# Patient Record
Sex: Female | Born: 1950
Health system: Southern US, Community
[De-identification: ages and names within clinical notes are randomized; demographics above are authoritative.]

## PROBLEM LIST (undated history)

## (undated) DIAGNOSIS — R197 Diarrhea, unspecified: Secondary | ICD-10-CM

## (undated) DIAGNOSIS — K589 Irritable bowel syndrome without diarrhea: Secondary | ICD-10-CM

## (undated) DIAGNOSIS — Z9889 Other specified postprocedural states: Secondary | ICD-10-CM

## (undated) DIAGNOSIS — J40 Bronchitis, not specified as acute or chronic: Secondary | ICD-10-CM

## (undated) HISTORY — PX: SHOULDER OPEN ROTATOR CUFF REPAIR: SHX2407

## (undated) HISTORY — PX: LYMPH NODE BIOPSY: SHX201

## (undated) HISTORY — PX: PARTIAL HYSTERECTOMY: SHX80

## (undated) HISTORY — PX: LYMPH GLAND EXCISION: SHX13

## (undated) HISTORY — PX: ABDOMINAL HYSTERECTOMY: SHX81

## (undated) HISTORY — PX: JOINT REPLACEMENT: SHX530

## (undated) HISTORY — PX: BLADDER SURGERY: SHX569

---

## 1997-09-04 ENCOUNTER — Ambulatory Visit (HOSPITAL_COMMUNITY): Admission: RE | Admit: 1997-09-04 | Discharge: 1997-09-05 | Payer: Self-pay | Admitting: Neurosurgery

## 1997-09-18 ENCOUNTER — Ambulatory Visit (HOSPITAL_COMMUNITY): Admission: RE | Admit: 1997-09-18 | Discharge: 1997-09-18 | Payer: Self-pay | Admitting: Neurosurgery

## 1998-07-30 ENCOUNTER — Encounter: Payer: Self-pay | Admitting: Neurosurgery

## 1998-07-30 ENCOUNTER — Ambulatory Visit (HOSPITAL_COMMUNITY): Admission: RE | Admit: 1998-07-30 | Discharge: 1998-07-30 | Payer: Self-pay | Admitting: Neurosurgery

## 1998-08-19 ENCOUNTER — Encounter: Payer: Self-pay | Admitting: Urology

## 1998-08-24 ENCOUNTER — Observation Stay (HOSPITAL_COMMUNITY): Admission: RE | Admit: 1998-08-24 | Discharge: 1998-08-25 | Payer: Self-pay | Admitting: Urology

## 1998-10-06 ENCOUNTER — Other Ambulatory Visit: Admission: RE | Admit: 1998-10-06 | Discharge: 1998-10-06 | Payer: Self-pay | Admitting: *Deleted

## 2001-12-13 ENCOUNTER — Encounter: Admission: RE | Admit: 2001-12-13 | Discharge: 2001-12-13 | Payer: Self-pay

## 2001-12-27 ENCOUNTER — Ambulatory Visit (HOSPITAL_BASED_OUTPATIENT_CLINIC_OR_DEPARTMENT_OTHER): Admission: RE | Admit: 2001-12-27 | Discharge: 2001-12-27 | Payer: Self-pay | Admitting: Otolaryngology

## 2001-12-27 ENCOUNTER — Encounter (INDEPENDENT_AMBULATORY_CARE_PROVIDER_SITE_OTHER): Payer: Self-pay | Admitting: *Deleted

## 2002-06-24 ENCOUNTER — Ambulatory Visit (HOSPITAL_COMMUNITY): Admission: RE | Admit: 2002-06-24 | Discharge: 2002-06-24 | Payer: Self-pay

## 2004-03-24 ENCOUNTER — Emergency Department (HOSPITAL_COMMUNITY): Admission: EM | Admit: 2004-03-24 | Discharge: 2004-03-24 | Payer: Self-pay | Admitting: Emergency Medicine

## 2004-09-07 ENCOUNTER — Ambulatory Visit: Payer: Self-pay | Admitting: Internal Medicine

## 2005-02-08 ENCOUNTER — Ambulatory Visit: Payer: Self-pay | Admitting: Internal Medicine

## 2007-03-20 ENCOUNTER — Other Ambulatory Visit: Admission: RE | Admit: 2007-03-20 | Discharge: 2007-03-20 | Payer: Self-pay | Admitting: Family Medicine

## 2007-03-25 ENCOUNTER — Ambulatory Visit (HOSPITAL_COMMUNITY): Admission: RE | Admit: 2007-03-25 | Discharge: 2007-03-25 | Payer: Self-pay | Admitting: Family Medicine

## 2007-04-03 ENCOUNTER — Encounter: Admission: RE | Admit: 2007-04-03 | Discharge: 2007-04-03 | Payer: Self-pay | Admitting: Family Medicine

## 2009-02-16 ENCOUNTER — Emergency Department (HOSPITAL_COMMUNITY): Admission: EM | Admit: 2009-02-16 | Discharge: 2009-02-16 | Payer: Self-pay | Admitting: Emergency Medicine

## 2009-03-18 ENCOUNTER — Encounter: Admission: RE | Admit: 2009-03-18 | Discharge: 2009-03-18 | Payer: Self-pay | Admitting: Gastroenterology

## 2009-09-03 ENCOUNTER — Emergency Department (HOSPITAL_COMMUNITY): Admission: EM | Admit: 2009-09-03 | Discharge: 2009-09-03 | Payer: Self-pay | Admitting: Family Medicine

## 2009-09-09 ENCOUNTER — Emergency Department (HOSPITAL_COMMUNITY): Admission: EM | Admit: 2009-09-09 | Discharge: 2009-09-09 | Payer: Self-pay | Admitting: Family Medicine

## 2009-09-17 ENCOUNTER — Emergency Department (HOSPITAL_COMMUNITY): Admission: EM | Admit: 2009-09-17 | Discharge: 2009-09-17 | Payer: Self-pay | Admitting: Emergency Medicine

## 2009-10-04 ENCOUNTER — Emergency Department (HOSPITAL_COMMUNITY): Admission: EM | Admit: 2009-10-04 | Discharge: 2009-10-04 | Payer: Self-pay | Admitting: Family Medicine

## 2010-01-30 ENCOUNTER — Encounter: Payer: Self-pay | Admitting: Family Medicine

## 2010-03-24 LAB — CBC
Hemoglobin: 15.6 g/dL — ABNORMAL HIGH (ref 12.0–15.0)
Platelets: 250 10*3/uL (ref 150–400)
RBC: 4.72 MIL/uL (ref 3.87–5.11)
RDW: 13.4 % (ref 11.5–15.5)

## 2010-03-24 LAB — POCT I-STAT, CHEM 8
BUN: 8 mg/dL (ref 6–23)
HCT: 49 % — ABNORMAL HIGH (ref 36.0–46.0)
Sodium: 141 mEq/L (ref 135–145)
TCO2: 30 mmol/L (ref 0–100)

## 2010-03-31 LAB — URINALYSIS, ROUTINE W REFLEX MICROSCOPIC: Specific Gravity, Urine: 1.001 — ABNORMAL LOW (ref 1.005–1.030)

## 2010-05-27 NOTE — Op Note (Signed)
NAMEJACKLIN, Lacey Skinner                          ACCOUNT NO.:  [id number]   MEDICAL RECORD NO.:  [id number]                   PATIENT TYPE:  AMB   LOCATION:  [facility]SC                                  FACILITY:  [facility]   PHYSICIAN:  Christopher E. Ezzard Standing, M.D.         DATE OF BIRTH:  [birth year]   DATE OF PROCEDURE:  12/27/2001  DATE OF DISCHARGE:                                 OPERATIVE REPORT   PREOPERATIVE DIAGNOSIS:  Hoarseness with leukoplakia.   POSTOPERATIVE DIAGNOSIS:  Hoarseness with leukoplakia.   PROCEDURE:  Microlaryngoscopy with vocal cord stripping and biopsy of vocal  cords bilaterally.   SURGEON:  Kristine Garbe. Ezzard Standing, M.D.   ANESTHESIA:  General endotracheal.   COMPLICATIONS:  None.   BRIEF CLINICAL NOTE:  The patient is a 60 year old female who has a  significant smoking history, approximately two packs per day for a number of  years.  She has had chronic hoarseness over the last couple of months and on  examination has bilateral vocal cord edema, especially on the left side,  along with leukoplakia bilaterally of the vocal cords.  She was taken to the  operating room at this time for vocal cord stripping and biopsy of the  leukoplakia areas.  Of note, she has a significant history of family history  with upper airway cancers.   DESCRIPTION OF PROCEDURE:  After adequate endotracheal anesthesia, direct  laryngoscopy was performed.  The base of tongue, valleculae, epiglottis all  appeared normal.  Both piriform sinuses were clear with normal mucosal  covering.  Next the endolarynx was examined.  The false cords were normal.  On evaluation of the true vocal cords, the patient had significantly more  edema of the left true vocal cord than the right.  There were areas of  leukoplakia along the anterior portion of the vocal cords bilaterally.  The  laryngoscope was suspended.  A couple of photos were obtained of the vocal  cords prior to biopsy.  First the right true  vocal cord was stripped along  with an area of leukoplakia, being careful to preserve the anterior  commissure region.  This was sent as a separate specimen and following this,  the more edematous left true vocal cord was stripped along with an area of  leukoplakia.  A cottonoid soaked in adrenalin was placed for hemostasis.  This completed the procedure.  The patient was awoken from anesthesia and  transferred to the recovery room postop doing well.    DISPOSITION:  The patient is discharged home later this morning.  Will place  on antacid therapy, Prilosec 20 mg a day for the next three weeks, as she  has done this previously.  Instructions on voice rest and try to decrease  smoking.  Will have her follow up in my office in two to three weeks for  recheck and to review pathology.  Kristine Garbe. Ezzard Standing, M.D.    CEN/MEDQ  D:  12/27/2001  T:  12/28/2001  Job:  [id number]   cc:   Christella Noa, M.D.  [address] [place]  [place], [place] [address]  Fax: [phone]

## 2010-07-07 ENCOUNTER — Other Ambulatory Visit: Payer: Self-pay | Admitting: Gastroenterology

## 2010-07-07 DIAGNOSIS — R1013 Epigastric pain: Secondary | ICD-10-CM

## 2010-07-11 ENCOUNTER — Ambulatory Visit
Admission: RE | Admit: 2010-07-11 | Discharge: 2010-07-11 | Disposition: A | Discharge status: Home / Self Care | Payer: No Typology Code available for payment source | Source: Ambulatory Visit | Attending: Gastroenterology | Admitting: Gastroenterology

## 2010-07-11 DIAGNOSIS — R1013 Epigastric pain: Secondary | ICD-10-CM

## 2010-07-12 ENCOUNTER — Inpatient Hospital Stay (INDEPENDENT_AMBULATORY_CARE_PROVIDER_SITE_OTHER)
Admission: RE | Admit: 2010-07-12 | Discharge: 2010-07-12 | Disposition: A | Discharge status: Home / Self Care | Payer: No Typology Code available for payment source | Source: Ambulatory Visit | Attending: Family Medicine | Admitting: Family Medicine

## 2010-07-12 DIAGNOSIS — T50995A Adverse effect of other drugs, medicaments and biological substances, initial encounter: Secondary | ICD-10-CM

## 2010-07-12 DIAGNOSIS — I10 Essential (primary) hypertension: Secondary | ICD-10-CM

## 2010-07-21 ENCOUNTER — Other Ambulatory Visit: Payer: Self-pay | Admitting: Gastroenterology

## 2011-12-29 ENCOUNTER — Emergency Department (HOSPITAL_COMMUNITY)
Admission: EM | Admit: 2011-12-29 | Discharge: 2011-12-29 | Disposition: A | Discharge status: Home / Self Care | Payer: No Typology Code available for payment source | Source: Home / Self Care

## 2011-12-29 ENCOUNTER — Emergency Department (INDEPENDENT_AMBULATORY_CARE_PROVIDER_SITE_OTHER): Payer: No Typology Code available for payment source

## 2011-12-29 ENCOUNTER — Encounter (HOSPITAL_COMMUNITY): Payer: Self-pay

## 2011-12-29 DIAGNOSIS — M25519 Pain in unspecified shoulder: Secondary | ICD-10-CM

## 2011-12-29 DIAGNOSIS — M7500 Adhesive capsulitis of unspecified shoulder: Secondary | ICD-10-CM

## 2011-12-29 DIAGNOSIS — F17200 Nicotine dependence, unspecified, uncomplicated: Secondary | ICD-10-CM

## 2011-12-29 MED ORDER — HYDROCODONE-ACETAMINOPHEN 7.5-500 MG PO TABS: 1.0000 | ORAL_TABLET | Freq: Four times a day (QID) | ORAL | Status: DC | PRN

## 2011-12-29 NOTE — ED Provider Notes (Signed)
History     CSN: [id number]  Arrival date & time 12/29/11  1647   Chief Complaint  Patient presents with  . Shoulder Pain   HPI Pt reports a 2 years of worsening left shoulder pain and disability.  She is not having difficulty with abduction and internal rotation of shoulder.  She is not able to sleep well at night.  She is reporting that she is weaker in the left shoulder and not able to sleep well.  She is having difficulty driving because of shoulder pain.    History reviewed. No pertinent past medical history.  Past Surgical History  Procedure Date  . Lymph node biopsy     No family history on file.  History  Substance Use Topics  . Smoking status: Not on file  . Smokeless tobacco: Not on file  . Alcohol Use:     OB History    Grav Para Term Preterm Abortions TAB SAB Ect Mult Living                 Review of Systems  Constitutional: Negative.   HENT: Negative.   Eyes: Negative.   Respiratory: Negative.   Cardiovascular: Negative.   Gastrointestinal: Negative.   Musculoskeletal: Positive for joint swelling and arthralgias.    Allergies  Cortizone-10  Home Medications  No current outpatient prescriptions on file.  BP 138/79  Pulse 79  Temp 97.7 F (36.5 C) (Oral)  Resp 21  SpO2 97%  Physical Exam  Nursing note and vitals reviewed. Constitutional: She is oriented to person, place, and time. She appears well-developed and well-nourished.  HENT:  Head: Normocephalic and atraumatic.  Eyes: EOM are normal. Pupils are equal, round, and reactive to light.  Cardiovascular: Normal rate, regular rhythm and normal heart sounds.   Pulmonary/Chest: Effort normal and breath sounds normal.  Abdominal: Soft. Bowel sounds are normal.  Musculoskeletal:       Frozen left shoulder, severe pain with abduction, internal rotation  Neurological: She is alert and oriented to person, place, and time.  Skin: Skin is warm and dry.  Psychiatric: She has a normal mood and  affect. Her behavior is normal. Judgment and thought content normal.    ED Course  Procedures (including critical care time)  Labs Reviewed - No data to display No results found.  No diagnosis found.   MDM  IMPRESSION  Frozen shoulder - left  Severe shoulder pain  - left  RECOMMENDATIONS / PLAN Orthopedic referral ordered  Xray left shoulder today lortab 7.5 mg po every 6 hours prn severe pain  FOLLOW UP 3 weeks   The patient was given clear instructions to go to ER or return to medical center if symptoms don't improve, worsen or new problems develop.  The patient verbalized understanding.  The patient was told to call to get lab results if they haven't heard anything in the next week.            Cleora Fleet, MD 12/29/11 2156

## 2011-12-29 NOTE — ED Notes (Signed)
C/o left shoulder pain for almost 2 years. States hurt it lifting her husband off the floor

## 2012-01-23 MED ORDER — HYDROCODONE-ACETAMINOPHEN 5-325 MG PO TABS: ORAL_TABLET | ORAL | Status: DC

## 2012-01-25 NOTE — ED Notes (Signed)
Referred to [facility] appt 02/19/12

## 2012-05-30 ENCOUNTER — Ambulatory Visit: Payer: No Typology Code available for payment source | Attending: Orthopaedic Surgery | Admitting: Physical Therapy

## 2012-05-30 DIAGNOSIS — M25519 Pain in unspecified shoulder: Secondary | ICD-10-CM | POA: Insufficient documentation

## 2012-05-30 DIAGNOSIS — IMO0001 Reserved for inherently not codable concepts without codable children: Secondary | ICD-10-CM | POA: Insufficient documentation

## 2012-05-30 DIAGNOSIS — M25619 Stiffness of unspecified shoulder, not elsewhere classified: Secondary | ICD-10-CM | POA: Insufficient documentation

## 2012-06-11 ENCOUNTER — Ambulatory Visit: Payer: No Typology Code available for payment source | Attending: Orthopaedic Surgery | Admitting: Physical Therapy

## 2012-06-11 DIAGNOSIS — M25619 Stiffness of unspecified shoulder, not elsewhere classified: Secondary | ICD-10-CM | POA: Insufficient documentation

## 2012-06-11 DIAGNOSIS — IMO0001 Reserved for inherently not codable concepts without codable children: Secondary | ICD-10-CM | POA: Insufficient documentation

## 2012-06-11 DIAGNOSIS — M25519 Pain in unspecified shoulder: Secondary | ICD-10-CM | POA: Insufficient documentation

## 2012-06-13 ENCOUNTER — Encounter: Payer: No Typology Code available for payment source | Admitting: Rehabilitation

## 2012-06-14 ENCOUNTER — Ambulatory Visit: Payer: No Typology Code available for payment source | Admitting: Rehabilitation

## 2012-06-17 ENCOUNTER — Ambulatory Visit: Payer: No Typology Code available for payment source | Admitting: Physical Therapy

## 2012-06-19 ENCOUNTER — Ambulatory Visit: Payer: No Typology Code available for payment source | Admitting: Physical Therapy

## 2012-06-24 ENCOUNTER — Ambulatory Visit: Payer: No Typology Code available for payment source | Admitting: Physical Therapy

## 2012-06-26 ENCOUNTER — Ambulatory Visit: Payer: No Typology Code available for payment source | Admitting: Physical Therapy

## 2012-07-01 ENCOUNTER — Ambulatory Visit: Payer: No Typology Code available for payment source | Admitting: Physical Therapy

## 2012-07-03 ENCOUNTER — Ambulatory Visit: Payer: No Typology Code available for payment source | Admitting: Physical Therapy

## 2012-07-09 ENCOUNTER — Ambulatory Visit: Payer: No Typology Code available for payment source | Attending: Orthopaedic Surgery | Admitting: Physical Therapy

## 2012-07-09 DIAGNOSIS — M25519 Pain in unspecified shoulder: Secondary | ICD-10-CM | POA: Insufficient documentation

## 2012-07-09 DIAGNOSIS — M25619 Stiffness of unspecified shoulder, not elsewhere classified: Secondary | ICD-10-CM | POA: Insufficient documentation

## 2012-07-09 DIAGNOSIS — IMO0001 Reserved for inherently not codable concepts without codable children: Secondary | ICD-10-CM | POA: Insufficient documentation

## 2012-07-11 ENCOUNTER — Ambulatory Visit: Payer: No Typology Code available for payment source | Admitting: Physical Therapy

## 2012-07-15 ENCOUNTER — Ambulatory Visit: Payer: No Typology Code available for payment source | Admitting: Physical Therapy

## 2012-07-18 ENCOUNTER — Ambulatory Visit: Payer: No Typology Code available for payment source | Admitting: Physical Therapy

## 2012-07-23 ENCOUNTER — Ambulatory Visit: Payer: No Typology Code available for payment source | Admitting: Physical Therapy

## 2012-07-25 ENCOUNTER — Encounter: Payer: No Typology Code available for payment source | Admitting: Physical Therapy

## 2012-07-29 ENCOUNTER — Ambulatory Visit: Payer: No Typology Code available for payment source | Admitting: Physical Therapy

## 2012-08-01 ENCOUNTER — Ambulatory Visit: Payer: No Typology Code available for payment source | Admitting: Physical Therapy

## 2012-08-05 ENCOUNTER — Ambulatory Visit: Payer: No Typology Code available for payment source | Admitting: Physical Therapy

## 2012-08-07 ENCOUNTER — Ambulatory Visit: Payer: No Typology Code available for payment source | Admitting: Rehabilitation

## 2012-08-13 ENCOUNTER — Ambulatory Visit: Payer: No Typology Code available for payment source | Attending: Orthopaedic Surgery | Admitting: Physical Therapy

## 2012-08-13 DIAGNOSIS — M25619 Stiffness of unspecified shoulder, not elsewhere classified: Secondary | ICD-10-CM | POA: Insufficient documentation

## 2012-08-13 DIAGNOSIS — IMO0001 Reserved for inherently not codable concepts without codable children: Secondary | ICD-10-CM | POA: Insufficient documentation

## 2012-08-13 DIAGNOSIS — M25519 Pain in unspecified shoulder: Secondary | ICD-10-CM | POA: Insufficient documentation

## 2012-08-15 ENCOUNTER — Encounter: Payer: No Typology Code available for payment source | Admitting: Physical Therapy

## 2012-08-19 ENCOUNTER — Ambulatory Visit: Payer: No Typology Code available for payment source | Admitting: Physical Therapy

## 2012-08-26 ENCOUNTER — Ambulatory Visit: Payer: No Typology Code available for payment source | Admitting: Rehabilitation

## 2012-09-02 ENCOUNTER — Ambulatory Visit: Payer: No Typology Code available for payment source | Admitting: Physical Therapy

## 2012-09-03 ENCOUNTER — Ambulatory Visit: Payer: No Typology Code available for payment source | Admitting: Physical Therapy

## 2012-09-10 ENCOUNTER — Encounter: Payer: No Typology Code available for payment source | Admitting: Physical Therapy

## 2012-12-10 ENCOUNTER — Encounter (HOSPITAL_COMMUNITY): Payer: Self-pay | Admitting: Emergency Medicine

## 2012-12-10 ENCOUNTER — Emergency Department (HOSPITAL_COMMUNITY)
Admission: EM | Admit: 2012-12-10 | Discharge: 2012-12-10 | Disposition: A | Discharge status: Home / Self Care | Payer: No Typology Code available for payment source | Attending: Emergency Medicine | Admitting: Emergency Medicine

## 2012-12-10 DIAGNOSIS — Z8719 Personal history of other diseases of the digestive system: Secondary | ICD-10-CM | POA: Insufficient documentation

## 2012-12-10 DIAGNOSIS — R509 Fever, unspecified: Secondary | ICD-10-CM | POA: Insufficient documentation

## 2012-12-10 DIAGNOSIS — J209 Acute bronchitis, unspecified: Secondary | ICD-10-CM | POA: Insufficient documentation

## 2012-12-10 DIAGNOSIS — J40 Bronchitis, not specified as acute or chronic: Secondary | ICD-10-CM

## 2012-12-10 DIAGNOSIS — F17200 Nicotine dependence, unspecified, uncomplicated: Secondary | ICD-10-CM | POA: Insufficient documentation

## 2012-12-10 HISTORY — DX: Other specified postprocedural states: Z98.890

## 2012-12-10 HISTORY — DX: Bronchitis, not specified as acute or chronic: J40

## 2012-12-10 HISTORY — DX: Irritable bowel syndrome without diarrhea: K58.9

## 2012-12-10 HISTORY — DX: Diarrhea, unspecified: R19.7

## 2012-12-10 MED ORDER — AZITHROMYCIN 250 MG PO TABS: 250.0000 mg | ORAL_TABLET | Freq: Every day | ORAL | Status: DC

## 2012-12-10 MED ORDER — ALBUTEROL SULFATE HFA 108 (90 BASE) MCG/ACT IN AERS
2.0000 | INHALATION_SPRAY | RESPIRATORY_TRACT | Status: DC | PRN
  Administered 2012-12-10: 2 via RESPIRATORY_TRACT
  Filled 2012-12-10: qty 6.7

## 2012-12-10 NOTE — ED Notes (Addendum)
Pt from home with c/o cough, sinus pain, pressure and drainage, and headache.  Pt reports epitaxis from blowing her nose so often and low grade fever. Took Tylenol at 630 am.  Pt in NAD, A&O.

## 2012-12-10 NOTE — ED Provider Notes (Signed)
CSN: [id number]     Arrival date & time 12/10/12  5784 History   First MD Initiated Contact with Patient 12/10/12 [phone]     Chief Complaint  Patient presents with  . Cough   (Consider location/radiation/quality/duration/timing/severity/associated sxs/prior Treatment) Patient is a 62 y.o. female presenting with cough. The history is provided by the patient. No language interpreter was used.  Cough Associated symptoms: fever   Associated symptoms: no chest pain, no headaches, no shortness of breath and no sore throat   Associated symptoms comment:  Symptoms of cough, sinus congestion, nasal drainage, now with subjective fever. She reports symptoms have been coming on over 2 months, worse in the last week. No vomiting. She denies shortness of breath.   Past Medical History  Diagnosis Date  . Bronchitis   . Diarrhea   . IBS (irritable bowel syndrome)    Past Surgical History  Procedure Laterality Date  . Lymph node biopsy    . Abdominal hysterectomy    . Lymph gland excision      Left Leg  . Partial hysterectomy    . Shoulder open rotator cuff repair      spurs removed too, left shoulder  . Bladder surgery      x2  . Joint replacement     History reviewed. No pertinent family history. History  Substance Use Topics  . Smoking status: Current Every Day Smoker -- 1.00 packs/day    Types: Cigarettes  . Smokeless tobacco: Never Used  . Alcohol Use: No   OB History   Grav Para Term Preterm Abortions TAB SAB Ect Mult Living                 Review of Systems  Constitutional: Positive for fever.  HENT: Positive for congestion and sinus pressure. Negative for sore throat.   Respiratory: Positive for cough. Negative for shortness of breath.   Cardiovascular: Negative for chest pain.  Gastrointestinal: Negative for nausea and abdominal pain.  Genitourinary: Negative for dysuria.  Neurological: Negative for headaches.    Allergies  Cortizone-10; Nicotine; and Other  Home  Medications   Current Outpatient Rx  Name  Route  Sig  Dispense  Refill  . acetaminophen (TYLENOL) 325 MG tablet   Oral   Take 650 mg by mouth every 6 (six) hours as needed.          BP 124/60  Pulse 66  Temp(Src) 98.3 F (36.8 C) (Oral)  Resp 24  SpO2 98% Physical Exam  Constitutional: She is oriented to person, place, and time. She appears well-developed and well-nourished.  HENT:  Head: Normocephalic.  Neck: Normal range of motion. Neck supple.  Cardiovascular: Normal rate and regular rhythm.   Pulmonary/Chest: Effort normal and breath sounds normal. She has no wheezes. She has no rales. She exhibits no tenderness.  Abdominal: Soft. Bowel sounds are normal. There is no tenderness. There is no rebound and no guarding.  Musculoskeletal: Normal range of motion. She exhibits no edema.  Neurological: She is alert and oriented to person, place, and time.  Skin: Skin is warm and dry. No rash noted.  Psychiatric: She has a normal mood and affect.    ED Course  Procedures (including critical care time) Labs Review Labs Reviewed - No data to display Imaging Review No results found.  EKG Interpretation   None       MDM  No diagnosis found. 1. Bronchitis 2. Tobacco abuse  VSS, lungs are clear. She appears very stable.  Will tx with Z-pack, inhaler prn, encouraged PCP follow up.    Arnoldo Hooker, PA-C 12/10/12 [phone]

## 2012-12-10 NOTE — ED Provider Notes (Signed)
Medical screening examination/treatment/procedure(s) were performed by non-physician practitioner and as supervising physician I was immediately available for consultation/collaboration.  EKG Interpretation   None         Megan E Docherty, MD 12/10/12 2013 

## 2012-12-18 ENCOUNTER — Ambulatory Visit: Payer: No Typology Code available for payment source | Attending: Internal Medicine | Admitting: Internal Medicine

## 2012-12-18 ENCOUNTER — Encounter: Payer: Self-pay | Admitting: Internal Medicine

## 2012-12-18 ENCOUNTER — Ambulatory Visit (HOSPITAL_COMMUNITY)
Admission: RE | Admit: 2012-12-18 | Discharge: 2012-12-18 | Disposition: A | Discharge status: Home / Self Care | Payer: No Typology Code available for payment source | Source: Ambulatory Visit | Attending: Internal Medicine | Admitting: Internal Medicine

## 2012-12-18 VITALS — BP 143/46 | HR 80 | Temp 98.7°F | Resp 14 | Ht 62.0 in | Wt 128.8 lb

## 2012-12-18 DIAGNOSIS — R059 Cough, unspecified: Secondary | ICD-10-CM | POA: Insufficient documentation

## 2012-12-18 DIAGNOSIS — R0602 Shortness of breath: Secondary | ICD-10-CM | POA: Insufficient documentation

## 2012-12-18 DIAGNOSIS — J441 Chronic obstructive pulmonary disease with (acute) exacerbation: Secondary | ICD-10-CM

## 2012-12-18 DIAGNOSIS — R05 Cough: Secondary | ICD-10-CM | POA: Insufficient documentation

## 2012-12-18 LAB — CBC WITH DIFFERENTIAL/PLATELET
Basophils Relative: 0 % (ref 0–1)
Eosinophils Absolute: 0.1 10*3/uL (ref 0.0–0.7)
HCT: 42.1 % (ref 36.0–46.0)
Hemoglobin: 14.6 g/dL (ref 12.0–15.0)
Lymphocytes Relative: 40 % (ref 12–46)
MCHC: 34.7 g/dL (ref 30.0–36.0)
MCV: 90.7 fL (ref 78.0–100.0)
Monocytes Absolute: 0.5 10*3/uL (ref 0.1–1.0)
Monocytes Relative: 9 % (ref 3–12)
Neutro Abs: 3 10*3/uL (ref 1.7–7.7)
Neutrophils Relative %: 50 % (ref 43–77)
Platelets: 321 10*3/uL (ref 150–400)
RDW: 14 % (ref 11.5–15.5)
WBC: 6 10*3/uL (ref 4.0–10.5)

## 2012-12-18 LAB — COMPLETE METABOLIC PANEL WITH GFR
ALT: 12 U/L (ref 0–35)
AST: 16 U/L (ref 0–37)
Alkaline Phosphatase: 91 U/L (ref 39–117)
Chloride: 101 mEq/L (ref 96–112)
Sodium: 136 mEq/L (ref 135–145)

## 2012-12-18 LAB — LIPID PANEL
Cholesterol: 202 mg/dL — ABNORMAL HIGH (ref 0–200)
HDL: 65 mg/dL (ref >39)
LDL Cholesterol: 120 mg/dL — ABNORMAL HIGH (ref 0–99)
Total CHOL/HDL Ratio: 3.1 Ratio
Triglycerides: 85 mg/dL (ref <150)

## 2012-12-18 MED ORDER — LEVOFLOXACIN 500 MG PO TABS: 500.0000 mg | ORAL_TABLET | Freq: Every day | ORAL | Status: AC

## 2012-12-18 MED ORDER — PREDNISONE 20 MG PO TABS: 40.0000 mg | ORAL_TABLET | Freq: Every day | ORAL | Status: AC

## 2012-12-18 MED ORDER — FLUTICASONE-SALMETEROL 250-50 MCG/DOSE IN AEPB: 1.0000 | INHALATION_SPRAY | Freq: Two times a day (BID) | RESPIRATORY_TRACT | Status: DC

## 2012-12-18 MED ORDER — ALBUTEROL SULFATE HFA 108 (90 BASE) MCG/ACT IN AERS: 2.0000 | INHALATION_SPRAY | Freq: Four times a day (QID) | RESPIRATORY_TRACT | Status: DC | PRN

## 2012-12-18 NOTE — Progress Notes (Signed)
Patient ID: Lacey Skinner, female   DOB: 08-05-1950, 62 y.o.   MRN: [id number]   CC:  HPI: 62 year old female with a history of nicotine dependence, or smokes about a pack a day who presents to the clinic for a followup from ED visit on 12/2 and she was diagnosed with acute bronchitis. She was prescribed Z-Pak, albuterol and was advised to follow up with her PCP She has not received a flu vaccine and is scared to take it because of side effects. I did explain to her that benefits outweigh the risk. Patient still complains of dyspnea on exertion early in the morning. She also has a productive cough she has not had any fever.   Allergies  Allergen Reactions  . Cortizone-10 [Hydrocortisone] Hives  . Nicotine Hives and Other (See Comments)    Nicotine Patches  . Other Other (See Comments)    States that "several medications give me hives but not sure of the names"   Past Medical History  Diagnosis Date  . Bronchitis   . Diarrhea   . IBS (irritable bowel syndrome)   . Status post spinal disc removal     C4-C5   Current Outpatient Prescriptions on File Prior to Visit  Medication Sig Dispense Refill  . acetaminophen (TYLENOL) 325 MG tablet Take 650 mg by mouth every 6 (six) hours as needed.       No current facility-administered medications on file prior to visit.   No family history on file. History   Social History  . Marital Status: Widowed    Spouse Name: N/A    Number of Children: N/A  . Years of Education: N/A   Occupational History  . Not on file.   Social History Main Topics  . Smoking status: Current Every Day Smoker -- 1.00 packs/day    Types: Cigarettes  . Smokeless tobacco: Never Used  . Alcohol Use: No  . Drug Use: No  . Sexual Activity: No   Other Topics Concern  . Not on file   Social History Narrative  . No narrative on file    Review of Systems  Constitutional: Negative for fever, chills, diaphoresis, activity change, appetite change and fatigue.   HENT: Negative for ear pain, nosebleeds, congestion, facial swelling, rhinorrhea, neck pain, neck stiffness and ear discharge.   Eyes: Negative for pain, discharge, redness, itching and visual disturbance.  Respiratory: Negative for cough, choking, chest tightness, shortness of breath, wheezing and stridor.   Cardiovascular: Negative for chest pain, palpitations and leg swelling.  Gastrointestinal: Negative for abdominal distention.  Genitourinary: Negative for dysuria, urgency, frequency, hematuria, flank pain, decreased urine volume, difficulty urinating and dyspareunia.  Musculoskeletal: Negative for back pain, joint swelling, arthralgias and gait problem.  Neurological: Negative for dizziness, tremors, seizures, syncope, facial asymmetry, speech difficulty, weakness, light-headedness, numbness and headaches.  Hematological: Negative for adenopathy. Does not bruise/bleed easily.  Psychiatric/Behavioral: Negative for hallucinations, behavioral problems, confusion, dysphoric mood, decreased concentration and agitation.    Objective:   Filed Vitals:   12/18/12 0949  BP: 143/46  Pulse: 80  Temp: 98.7 F (37.1 C)  Resp: 14    Physical Exam  Constitutional: Appears well-developed and well-nourished. No distress.  HENT: Normocephalic. External right and left ear normal. Oropharynx is clear and moist.  Eyes: Conjunctivae and EOM are normal. PERRLA, no scleral icterus.  Neck: Normal ROM. Neck supple. No JVD. No tracheal deviation. No thyromegaly.  CVS: RRR, S1/S2 +, no murmurs, no gallops, no carotid bruit.  Pulmonary:  Effort and breath sounds normal, no stridor, rhonchi, wheezes, rales.  Abdominal: Soft. BS +,  no distension, tenderness, rebound or guarding.  Musculoskeletal: Normal range of motion. No edema and no tenderness.  Lymphadenopathy: No lymphadenopathy noted, cervical, inguinal. Neuro: Alert. Normal reflexes, muscle tone coordination. No cranial nerve deficit. Skin: Skin is  warm and dry. No rash noted. Not diaphoretic. No erythema. No pallor.  Psychiatric: Normal mood and affect. Behavior, judgment, thought content normal.   Lab Results  Component Value Date   WBC 8.1 10/04/2009   HGB 16.7* 10/04/2009   HCT 49.0* 10/04/2009   MCV 96.0 10/04/2009   PLT 250 10/04/2009   Lab Results  Component Value Date   CREATININE 0.8 10/04/2009   BUN 8 10/04/2009   NA 141 10/04/2009   K 4.8 10/04/2009   CL 104 10/04/2009    No results found for this basename: HGBA1C   Lipid Panel  No results found for this basename: chol, trig, hdl, cholhdl, vldl, ldlcalc       Assessment and plan:   There are no active problems to display for this patient.      Acute bronchitis/COPD exacerbation Cough for 2 months, symptoms not improving with Z-Pak We'll do a chest x-ray Levaquin 500 mg for 5-7 days Start the patient on Advair 250 x 50 Prednisone 40 mg a day for 7 days Patient advised to followup sooner if his symptoms don't improve otherwise routine follow up and 2 months    The patient was given clear instructions to go to ER or return to medical center if symptoms don't improve, worsen or new problems develop. The patient verbalized understanding. The patient was told to call to get any lab results if not heard anything in the next week.

## 2012-12-18 NOTE — Progress Notes (Signed)
Pt is here for a hospital f/u. Requests antibiotics and an inhaler; would help with the coughing and mucus that isn't clearing up x2 months. Complains of back and shoulder pain x6 months, pain on level 6.

## 2012-12-20 ENCOUNTER — Telehealth: Payer: Self-pay | Admitting: *Deleted

## 2012-12-20 NOTE — Telephone Encounter (Signed)
Contacted pt to inform her that her lab results all came back normal and that the provider recommends a low cholesterol diet. Pt acknowledged her results with no problem.

## 2013-01-23 ENCOUNTER — Telehealth: Payer: Self-pay | Admitting: Internal Medicine

## 2013-01-23 MED ORDER — ALBUTEROL SULFATE HFA 108 (90 BASE) MCG/ACT IN AERS: 2.0000 | INHALATION_SPRAY | Freq: Four times a day (QID) | RESPIRATORY_TRACT | Status: DC | PRN

## 2013-01-23 MED ORDER — FLUTICASONE-SALMETEROL 250-50 MCG/DOSE IN AEPB: 1.0000 | INHALATION_SPRAY | Freq: Two times a day (BID) | RESPIRATORY_TRACT | Status: DC

## 2013-01-23 NOTE — Telephone Encounter (Signed)
Spoke with pt regarding medication refill meds reordered and sent to our pharmacy

## 2013-01-23 NOTE — Telephone Encounter (Signed)
Patient would like to have a medication refill for her inhaler. She would like the cheaper version from our pharmacy. Thank you!

## 2013-01-24 ENCOUNTER — Other Ambulatory Visit: Payer: Self-pay | Admitting: Internal Medicine

## 2013-01-24 MED ORDER — FLUTICASONE-SALMETEROL 250-50 MCG/DOSE IN AEPB: 1.0000 | INHALATION_SPRAY | Freq: Two times a day (BID) | RESPIRATORY_TRACT | Status: AC

## 2013-01-24 MED ORDER — ALBUTEROL SULFATE HFA 108 (90 BASE) MCG/ACT IN AERS: 2.0000 | INHALATION_SPRAY | Freq: Four times a day (QID) | RESPIRATORY_TRACT | Status: AC | PRN

## 2013-03-18 ENCOUNTER — Ambulatory Visit: Payer: Self-pay | Admitting: Internal Medicine

## 2013-09-08 ENCOUNTER — Other Ambulatory Visit: Payer: Self-pay | Admitting: Family Medicine

## 2013-09-08 ENCOUNTER — Other Ambulatory Visit (HOSPITAL_COMMUNITY)
Admission: RE | Admit: 2013-09-08 | Discharge: 2013-09-08 | Disposition: A | Discharge status: Home / Self Care | Payer: Medicare PPO | Source: Ambulatory Visit | Attending: Family Medicine | Admitting: Family Medicine

## 2013-09-08 DIAGNOSIS — Z01419 Encounter for gynecological examination (general) (routine) without abnormal findings: Secondary | ICD-10-CM | POA: Insufficient documentation

## 2013-09-11 LAB — CYTOLOGY - PAP

## 2014-02-16 DIAGNOSIS — Z720 Tobacco use: Secondary | ICD-10-CM | POA: Diagnosis not present

## 2014-02-16 DIAGNOSIS — R252 Cramp and spasm: Secondary | ICD-10-CM | POA: Diagnosis not present

## 2014-02-16 DIAGNOSIS — M25562 Pain in left knee: Secondary | ICD-10-CM | POA: Diagnosis not present

## 2014-03-02 ENCOUNTER — Ambulatory Visit: Payer: Medicare PPO | Attending: Family Medicine

## 2014-07-06 ENCOUNTER — Other Ambulatory Visit: Payer: Self-pay

## 2014-08-25 DIAGNOSIS — J45909 Unspecified asthma, uncomplicated: Secondary | ICD-10-CM | POA: Diagnosis not present

## 2014-08-25 DIAGNOSIS — F17200 Nicotine dependence, unspecified, uncomplicated: Secondary | ICD-10-CM | POA: Diagnosis not present

## 2014-08-25 DIAGNOSIS — F419 Anxiety disorder, unspecified: Secondary | ICD-10-CM | POA: Diagnosis not present

## 2014-08-25 DIAGNOSIS — Z1389 Encounter for screening for other disorder: Secondary | ICD-10-CM | POA: Diagnosis not present

## 2014-08-25 DIAGNOSIS — F329 Major depressive disorder, single episode, unspecified: Secondary | ICD-10-CM | POA: Diagnosis not present

## 2014-08-25 DIAGNOSIS — Z0000 Encounter for general adult medical examination without abnormal findings: Secondary | ICD-10-CM | POA: Diagnosis not present

## 2014-08-25 DIAGNOSIS — Z136 Encounter for screening for cardiovascular disorders: Secondary | ICD-10-CM | POA: Diagnosis not present

## 2014-08-25 DIAGNOSIS — K219 Gastro-esophageal reflux disease without esophagitis: Secondary | ICD-10-CM | POA: Diagnosis not present

## 2014-08-28 ENCOUNTER — Other Ambulatory Visit: Payer: Self-pay | Admitting: Family Medicine

## 2014-09-03 ENCOUNTER — Other Ambulatory Visit: Payer: Self-pay | Admitting: Family Medicine

## 2014-09-03 DIAGNOSIS — F17290 Nicotine dependence, other tobacco product, uncomplicated: Secondary | ICD-10-CM

## 2014-11-06 DIAGNOSIS — Z803 Family history of malignant neoplasm of breast: Secondary | ICD-10-CM | POA: Diagnosis not present

## 2014-11-06 DIAGNOSIS — Z1231 Encounter for screening mammogram for malignant neoplasm of breast: Secondary | ICD-10-CM | POA: Diagnosis not present

## 2015-02-02 DIAGNOSIS — B86 Scabies: Secondary | ICD-10-CM | POA: Diagnosis not present

## 2015-02-13 DIAGNOSIS — H5203 Hypermetropia, bilateral: Secondary | ICD-10-CM | POA: Diagnosis not present

## 2015-02-13 DIAGNOSIS — H5210 Myopia, unspecified eye: Secondary | ICD-10-CM | POA: Diagnosis not present

## 2015-06-15 ENCOUNTER — Other Ambulatory Visit: Payer: Self-pay | Admitting: Family Medicine

## 2015-06-15 ENCOUNTER — Ambulatory Visit
Admission: RE | Admit: 2015-06-15 | Discharge: 2015-06-15 | Disposition: A | Discharge status: Home / Self Care | Payer: Medicare PPO | Source: Ambulatory Visit | Attending: Family Medicine | Admitting: Family Medicine

## 2015-06-15 DIAGNOSIS — M546 Pain in thoracic spine: Secondary | ICD-10-CM

## 2015-06-15 DIAGNOSIS — Z9181 History of falling: Secondary | ICD-10-CM | POA: Diagnosis not present

## 2015-11-17 DIAGNOSIS — R252 Cramp and spasm: Secondary | ICD-10-CM | POA: Diagnosis not present

## 2015-11-17 DIAGNOSIS — F17200 Nicotine dependence, unspecified, uncomplicated: Secondary | ICD-10-CM | POA: Diagnosis not present

## 2015-11-17 DIAGNOSIS — Z1389 Encounter for screening for other disorder: Secondary | ICD-10-CM | POA: Diagnosis not present

## 2015-11-17 DIAGNOSIS — Z136 Encounter for screening for cardiovascular disorders: Secondary | ICD-10-CM | POA: Diagnosis not present

## 2015-11-17 DIAGNOSIS — J45909 Unspecified asthma, uncomplicated: Secondary | ICD-10-CM | POA: Diagnosis not present

## 2015-11-17 DIAGNOSIS — Z0000 Encounter for general adult medical examination without abnormal findings: Secondary | ICD-10-CM | POA: Diagnosis not present

## 2015-11-17 DIAGNOSIS — Z1159 Encounter for screening for other viral diseases: Secondary | ICD-10-CM | POA: Diagnosis not present

## 2015-11-17 DIAGNOSIS — Z1211 Encounter for screening for malignant neoplasm of colon: Secondary | ICD-10-CM | POA: Diagnosis not present

## 2015-11-17 DIAGNOSIS — K219 Gastro-esophageal reflux disease without esophagitis: Secondary | ICD-10-CM | POA: Diagnosis not present

## 2015-11-24 DIAGNOSIS — Z1211 Encounter for screening for malignant neoplasm of colon: Secondary | ICD-10-CM | POA: Diagnosis not present

## 2015-12-06 DIAGNOSIS — Z1231 Encounter for screening mammogram for malignant neoplasm of breast: Secondary | ICD-10-CM | POA: Diagnosis not present

## 2016-01-17 ENCOUNTER — Telehealth: Payer: Self-pay | Admitting: Obstetrics and Gynecology

## 2016-01-17 NOTE — Telephone Encounter (Signed)
Erroneous encounter

## 2016-01-18 ENCOUNTER — Other Ambulatory Visit: Payer: Self-pay | Admitting: Family Medicine

## 2016-01-18 ENCOUNTER — Ambulatory Visit
Admission: RE | Admit: 2016-01-18 | Discharge: 2016-01-18 | Disposition: A | Discharge status: Home / Self Care | Payer: Medicare PPO | Source: Ambulatory Visit | Attending: Family Medicine | Admitting: Family Medicine

## 2016-01-18 DIAGNOSIS — M25441 Effusion, right hand: Secondary | ICD-10-CM

## 2016-01-18 DIAGNOSIS — J45909 Unspecified asthma, uncomplicated: Secondary | ICD-10-CM | POA: Diagnosis not present

## 2016-01-18 DIAGNOSIS — M19041 Primary osteoarthritis, right hand: Secondary | ICD-10-CM | POA: Diagnosis not present

## 2016-01-18 DIAGNOSIS — R252 Cramp and spasm: Secondary | ICD-10-CM | POA: Diagnosis not present

## 2016-04-04 DIAGNOSIS — F17200 Nicotine dependence, unspecified, uncomplicated: Secondary | ICD-10-CM | POA: Diagnosis not present

## 2016-04-04 DIAGNOSIS — R05 Cough: Secondary | ICD-10-CM | POA: Diagnosis not present

## 2016-04-04 DIAGNOSIS — R5383 Other fatigue: Secondary | ICD-10-CM | POA: Diagnosis not present

## 2016-04-04 DIAGNOSIS — J439 Emphysema, unspecified: Secondary | ICD-10-CM | POA: Diagnosis not present

## 2016-04-04 DIAGNOSIS — R252 Cramp and spasm: Secondary | ICD-10-CM | POA: Diagnosis not present

## 2016-04-05 ENCOUNTER — Other Ambulatory Visit: Payer: Self-pay | Admitting: Family Medicine

## 2016-04-05 ENCOUNTER — Ambulatory Visit
Admission: RE | Admit: 2016-04-05 | Discharge: 2016-04-05 | Disposition: A | Discharge status: Home / Self Care | Payer: Medicare PPO | Source: Ambulatory Visit | Attending: Family Medicine | Admitting: Family Medicine

## 2016-04-05 DIAGNOSIS — R05 Cough: Secondary | ICD-10-CM

## 2016-04-05 DIAGNOSIS — R059 Cough, unspecified: Secondary | ICD-10-CM

## 2016-04-17 ENCOUNTER — Other Ambulatory Visit: Payer: Self-pay | Admitting: Cardiology

## 2016-04-17 DIAGNOSIS — K279 Peptic ulcer, site unspecified, unspecified as acute or chronic, without hemorrhage or perforation: Secondary | ICD-10-CM | POA: Diagnosis not present

## 2016-04-17 DIAGNOSIS — R079 Chest pain, unspecified: Secondary | ICD-10-CM

## 2016-04-17 DIAGNOSIS — I209 Angina pectoris, unspecified: Secondary | ICD-10-CM | POA: Diagnosis not present

## 2016-04-17 DIAGNOSIS — F17290 Nicotine dependence, other tobacco product, uncomplicated: Secondary | ICD-10-CM | POA: Diagnosis not present

## 2016-04-17 DIAGNOSIS — M1990 Unspecified osteoarthritis, unspecified site: Secondary | ICD-10-CM | POA: Diagnosis not present

## 2016-04-17 DIAGNOSIS — J449 Chronic obstructive pulmonary disease, unspecified: Secondary | ICD-10-CM | POA: Diagnosis not present

## 2016-04-17 DIAGNOSIS — Z720 Tobacco use: Secondary | ICD-10-CM | POA: Diagnosis not present

## 2016-04-17 DIAGNOSIS — Z716 Tobacco abuse counseling: Secondary | ICD-10-CM | POA: Diagnosis not present

## 2016-05-03 ENCOUNTER — Ambulatory Visit (HOSPITAL_COMMUNITY)
Admission: RE | Admit: 2016-05-03 | Discharge: 2016-05-03 | Disposition: A | Discharge status: Home / Self Care | Payer: Medicare HMO | Source: Ambulatory Visit | Attending: Cardiology | Admitting: Cardiology

## 2016-05-03 DIAGNOSIS — R079 Chest pain, unspecified: Secondary | ICD-10-CM

## 2016-05-03 DIAGNOSIS — F17290 Nicotine dependence, other tobacco product, uncomplicated: Secondary | ICD-10-CM | POA: Diagnosis not present

## 2016-05-03 DIAGNOSIS — I209 Angina pectoris, unspecified: Secondary | ICD-10-CM | POA: Diagnosis not present

## 2016-05-03 DIAGNOSIS — K279 Peptic ulcer, site unspecified, unspecified as acute or chronic, without hemorrhage or perforation: Secondary | ICD-10-CM | POA: Diagnosis not present

## 2016-05-03 DIAGNOSIS — J449 Chronic obstructive pulmonary disease, unspecified: Secondary | ICD-10-CM | POA: Diagnosis not present

## 2016-05-03 DIAGNOSIS — K219 Gastro-esophageal reflux disease without esophagitis: Secondary | ICD-10-CM | POA: Diagnosis not present

## 2016-05-03 DIAGNOSIS — M1990 Unspecified osteoarthritis, unspecified site: Secondary | ICD-10-CM | POA: Diagnosis not present

## 2016-05-03 DIAGNOSIS — R0609 Other forms of dyspnea: Secondary | ICD-10-CM | POA: Diagnosis not present

## 2016-05-03 MED ORDER — REGADENOSON 0.4 MG/5ML IV SOLN: 0.4000 mg | Freq: Once | INTRAVENOUS | Status: DC

## 2016-05-03 MED ORDER — REGADENOSON 0.4 MG/5ML IV SOLN
INTRAVENOUS | Status: AC
  Filled 2016-05-03: qty 5

## 2016-05-03 MED ORDER — TECHNETIUM TC 99M TETROFOSMIN IV KIT
30.0000 | PACK | Freq: Once | INTRAVENOUS | Status: AC | PRN
  Administered 2016-05-03: 30 via INTRAVENOUS

## 2016-05-03 MED ORDER — TECHNETIUM TC 99M TETROFOSMIN IV KIT
10.0000 | PACK | Freq: Once | INTRAVENOUS | Status: AC | PRN
  Administered 2016-05-03: 10 via INTRAVENOUS

## 2016-07-10 DIAGNOSIS — R292 Abnormal reflex: Secondary | ICD-10-CM | POA: Diagnosis not present

## 2016-07-10 DIAGNOSIS — M5417 Radiculopathy, lumbosacral region: Secondary | ICD-10-CM | POA: Diagnosis not present

## 2016-07-11 ENCOUNTER — Other Ambulatory Visit: Payer: Self-pay | Admitting: Family Medicine

## 2016-07-11 DIAGNOSIS — M5416 Radiculopathy, lumbar region: Secondary | ICD-10-CM

## 2016-07-23 ENCOUNTER — Ambulatory Visit
Admission: RE | Admit: 2016-07-23 | Discharge: 2016-07-23 | Disposition: A | Discharge status: Home / Self Care | Payer: Medicare HMO | Source: Ambulatory Visit | Attending: Family Medicine | Admitting: Family Medicine

## 2016-07-23 DIAGNOSIS — M47816 Spondylosis without myelopathy or radiculopathy, lumbar region: Secondary | ICD-10-CM | POA: Diagnosis not present

## 2016-07-23 DIAGNOSIS — M5416 Radiculopathy, lumbar region: Secondary | ICD-10-CM

## 2016-07-26 DIAGNOSIS — E0789 Other specified disorders of thyroid: Secondary | ICD-10-CM | POA: Diagnosis not present

## 2016-07-26 DIAGNOSIS — R070 Pain in throat: Secondary | ICD-10-CM | POA: Diagnosis not present

## 2016-07-26 DIAGNOSIS — M5417 Radiculopathy, lumbosacral region: Secondary | ICD-10-CM | POA: Diagnosis not present

## 2016-07-27 ENCOUNTER — Other Ambulatory Visit: Payer: Self-pay | Admitting: Family Medicine

## 2016-07-27 DIAGNOSIS — E0789 Other specified disorders of thyroid: Secondary | ICD-10-CM

## 2016-07-31 DIAGNOSIS — J320 Chronic maxillary sinusitis: Secondary | ICD-10-CM | POA: Diagnosis not present

## 2016-07-31 DIAGNOSIS — J370 Chronic laryngitis: Secondary | ICD-10-CM | POA: Diagnosis not present

## 2016-07-31 DIAGNOSIS — J342 Deviated nasal septum: Secondary | ICD-10-CM | POA: Diagnosis not present

## 2016-07-31 DIAGNOSIS — R490 Dysphonia: Secondary | ICD-10-CM | POA: Diagnosis not present

## 2016-07-31 DIAGNOSIS — B370 Candidal stomatitis: Secondary | ICD-10-CM | POA: Diagnosis not present

## 2016-07-31 DIAGNOSIS — J330 Polyp of nasal cavity: Secondary | ICD-10-CM | POA: Diagnosis not present

## 2016-07-31 DIAGNOSIS — J301 Allergic rhinitis due to pollen: Secondary | ICD-10-CM | POA: Diagnosis not present

## 2016-07-31 DIAGNOSIS — R05 Cough: Secondary | ICD-10-CM | POA: Diagnosis not present

## 2016-08-07 DIAGNOSIS — M542 Cervicalgia: Secondary | ICD-10-CM | POA: Diagnosis not present

## 2016-08-10 ENCOUNTER — Other Ambulatory Visit: Payer: Self-pay | Admitting: Orthopedic Surgery

## 2016-08-10 DIAGNOSIS — M546 Pain in thoracic spine: Secondary | ICD-10-CM

## 2016-08-10 DIAGNOSIS — M542 Cervicalgia: Secondary | ICD-10-CM

## 2016-08-13 ENCOUNTER — Ambulatory Visit
Admission: RE | Admit: 2016-08-13 | Discharge: 2016-08-13 | Disposition: A | Discharge status: Home / Self Care | Payer: Medicare HMO | Source: Ambulatory Visit | Attending: Orthopedic Surgery | Admitting: Orthopedic Surgery

## 2016-08-13 DIAGNOSIS — M546 Pain in thoracic spine: Secondary | ICD-10-CM | POA: Diagnosis not present

## 2016-08-13 DIAGNOSIS — M5020 Other cervical disc displacement, unspecified cervical region: Secondary | ICD-10-CM | POA: Diagnosis not present

## 2016-08-13 DIAGNOSIS — M5031 Other cervical disc degeneration,  high cervical region: Secondary | ICD-10-CM | POA: Diagnosis not present

## 2016-08-13 DIAGNOSIS — M542 Cervicalgia: Secondary | ICD-10-CM

## 2016-08-18 DIAGNOSIS — M542 Cervicalgia: Secondary | ICD-10-CM | POA: Diagnosis not present

## 2016-08-21 IMAGING — CR DG THORACIC SPINE 3V
3 series · 3 of 3 positions shown · non-contrast
Comparison: 12/18/2012

CLINICAL DATA: Fall onto concrete floor 3 months ago with
persistent pain, initial encounter

EXAM:
THORACIC SPINE - 3 VIEWS

[w thoracic spine ap]
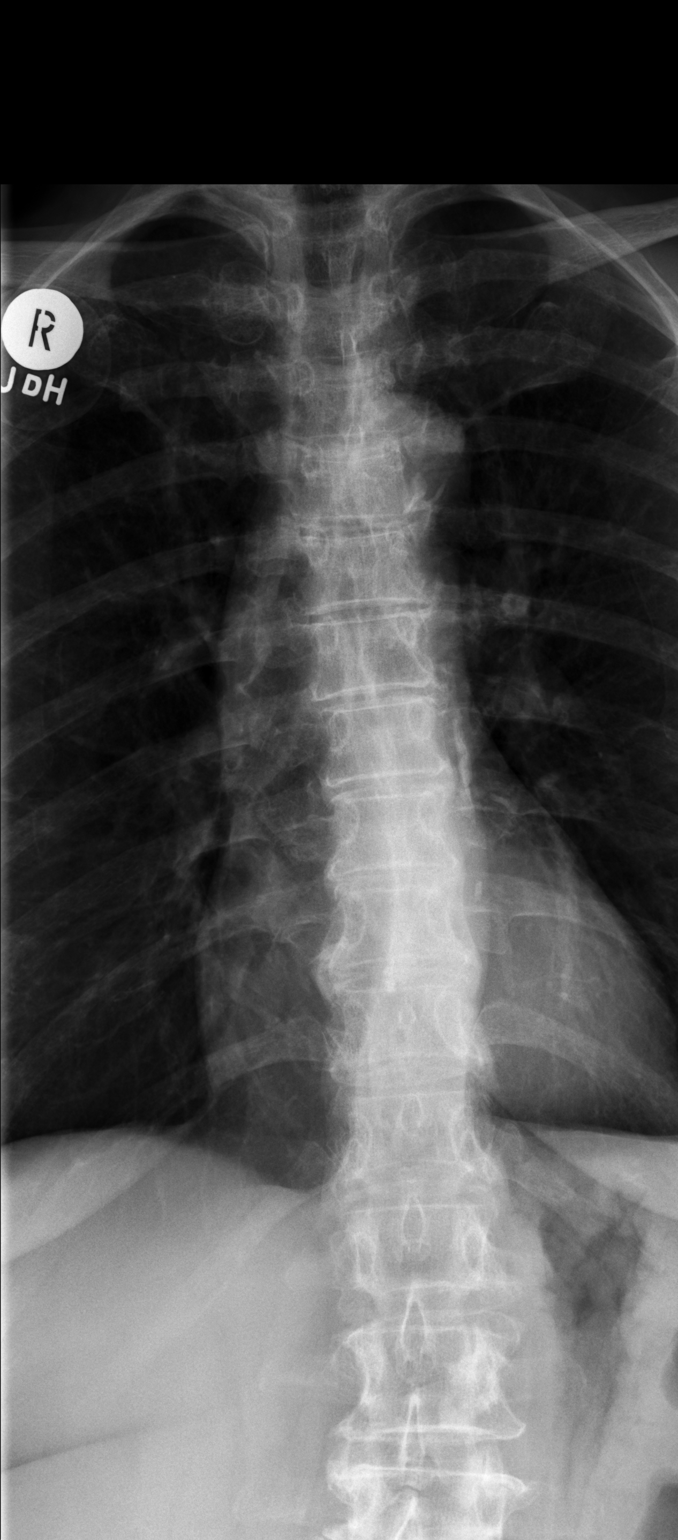

[w thoracic spine lat]
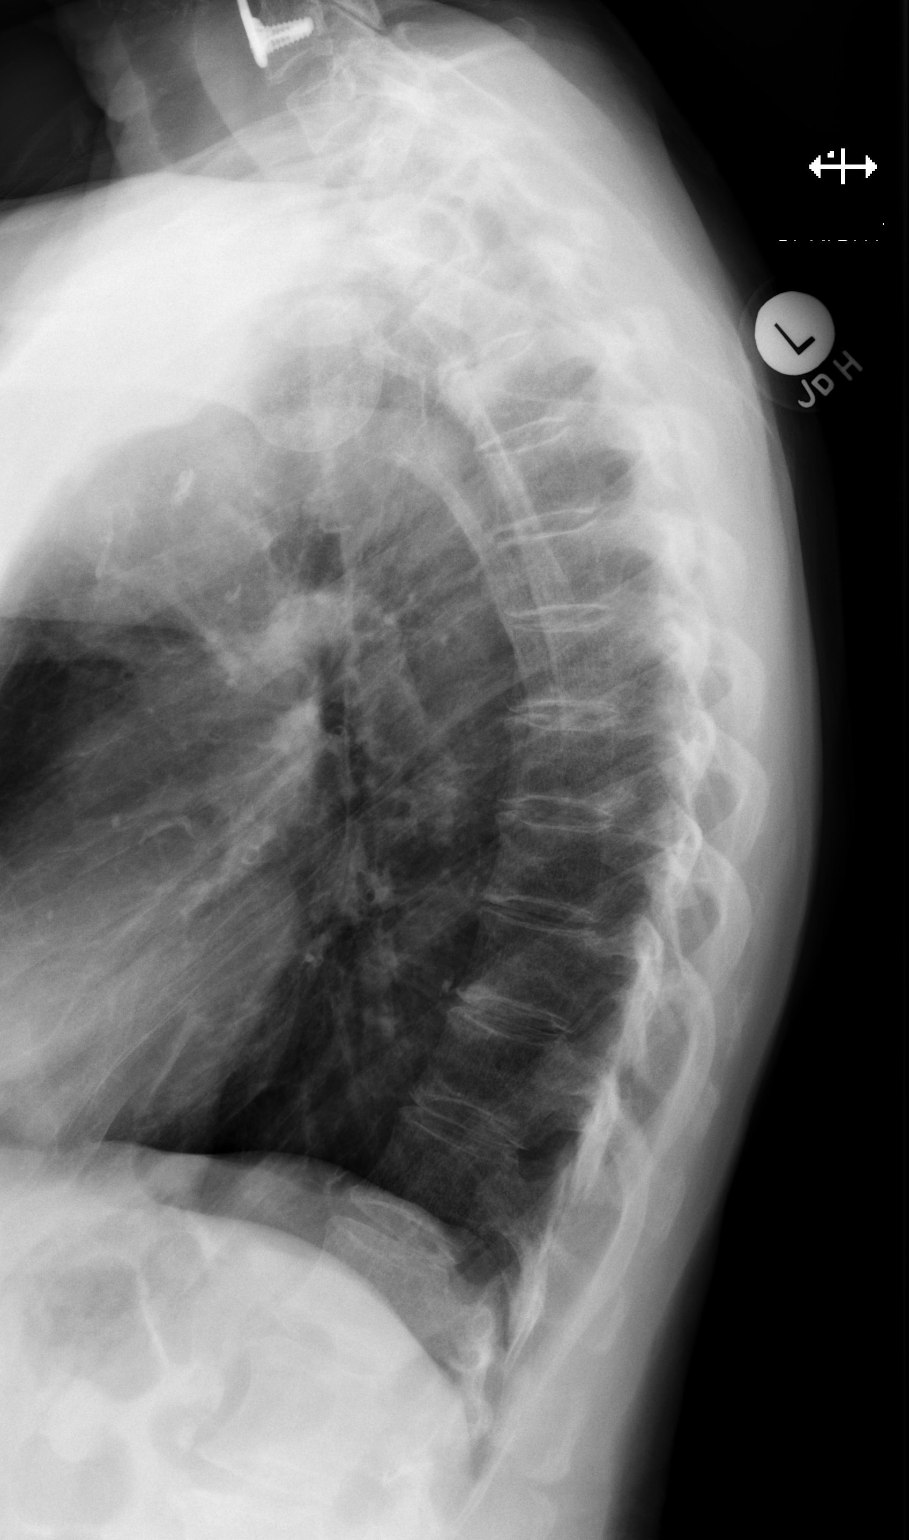

[w thoracic swimmers]
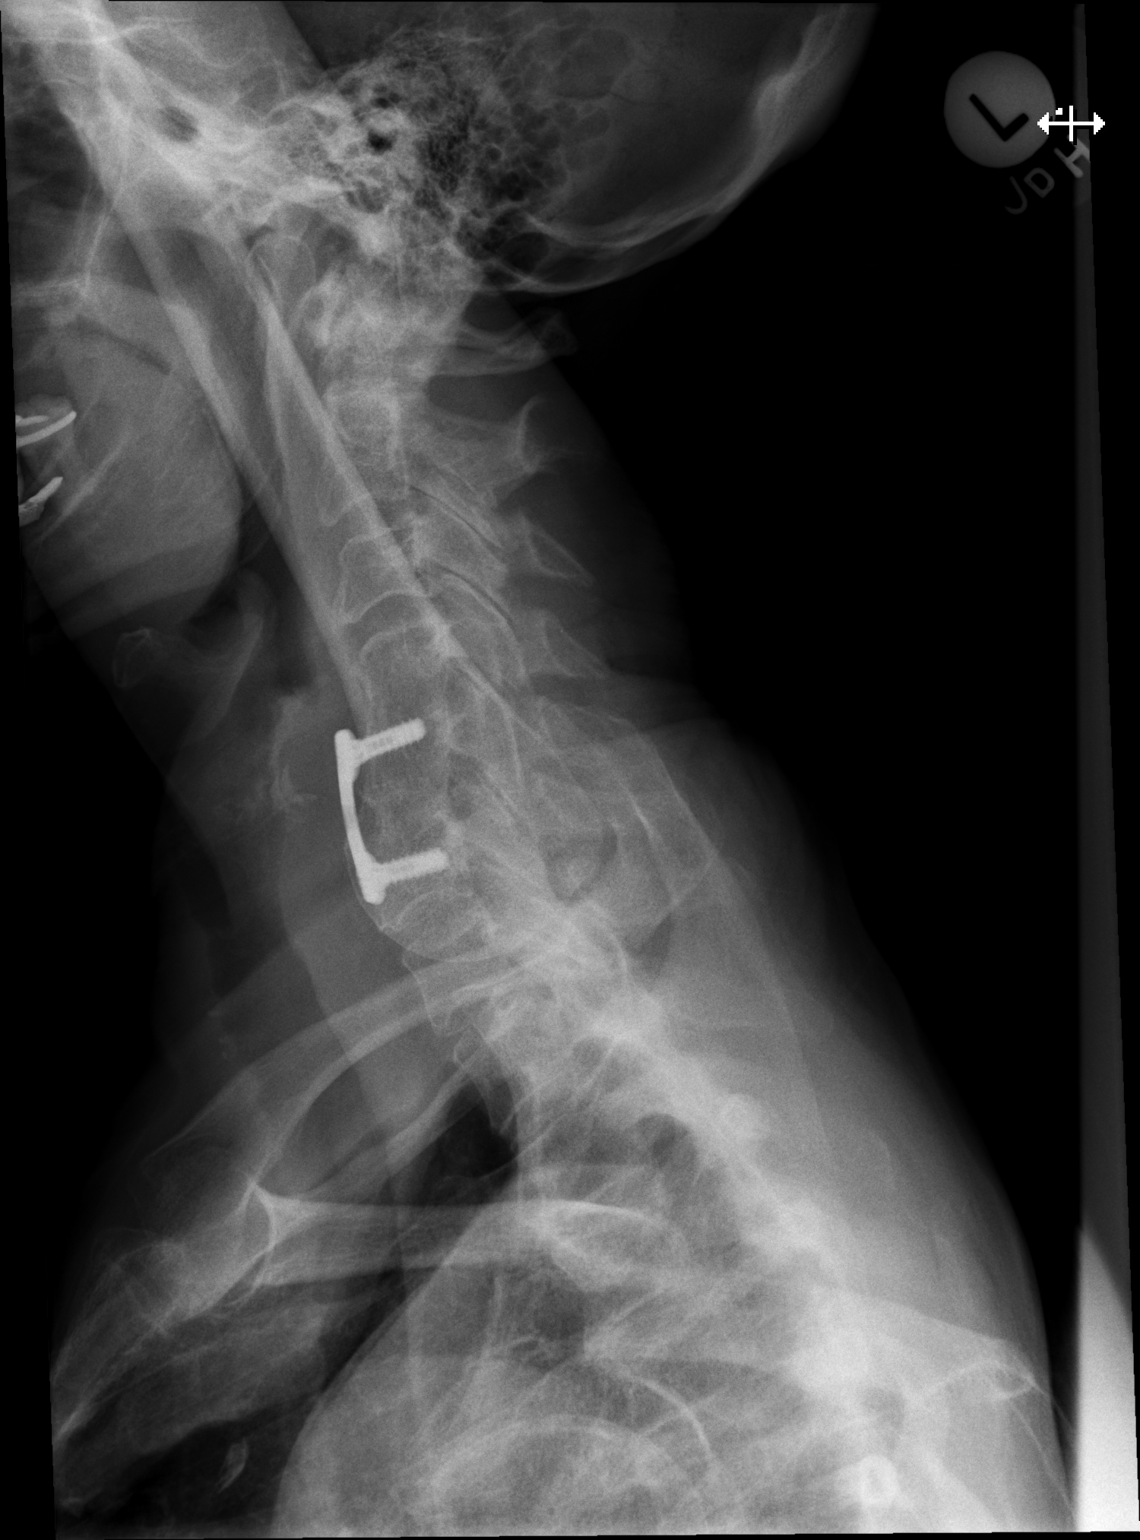

[3 of 3 positions shown; findings below may reference images not displayed]

FINDINGS: Postsurgical changes are noted in the cervical spine. Vertebral body
height is well maintained. Pedicles are within normal limits. No
paraspinal mass lesion is seen.
IMPRESSION: No acute abnormality noted.

## 2016-08-24 DIAGNOSIS — J322 Chronic ethmoidal sinusitis: Secondary | ICD-10-CM | POA: Diagnosis not present

## 2016-08-24 DIAGNOSIS — J040 Acute laryngitis: Secondary | ICD-10-CM | POA: Diagnosis not present

## 2016-08-24 DIAGNOSIS — R490 Dysphonia: Secondary | ICD-10-CM | POA: Diagnosis not present

## 2016-08-24 DIAGNOSIS — J320 Chronic maxillary sinusitis: Secondary | ICD-10-CM | POA: Diagnosis not present

## 2016-08-30 ENCOUNTER — Ambulatory Visit
Admission: RE | Admit: 2016-08-30 | Discharge: 2016-08-30 | Disposition: A | Discharge status: Home / Self Care | Payer: Medicare HMO | Source: Ambulatory Visit | Attending: Family Medicine | Admitting: Family Medicine

## 2016-08-30 DIAGNOSIS — E0789 Other specified disorders of thyroid: Secondary | ICD-10-CM

## 2016-08-30 DIAGNOSIS — E041 Nontoxic single thyroid nodule: Secondary | ICD-10-CM | POA: Diagnosis not present

## 2016-09-07 ENCOUNTER — Other Ambulatory Visit: Payer: Self-pay | Admitting: Family Medicine

## 2016-09-07 DIAGNOSIS — E041 Nontoxic single thyroid nodule: Secondary | ICD-10-CM

## 2016-09-12 ENCOUNTER — Other Ambulatory Visit (HOSPITAL_COMMUNITY)
Admission: RE | Admit: 2016-09-12 | Discharge: 2016-09-12 | Disposition: A | Discharge status: Home / Self Care | Payer: Medicare HMO | Source: Ambulatory Visit | Attending: Radiology | Admitting: Radiology

## 2016-09-12 ENCOUNTER — Ambulatory Visit
Admission: RE | Admit: 2016-09-12 | Discharge: 2016-09-12 | Disposition: A | Discharge status: Home / Self Care | Payer: Medicare HMO | Source: Ambulatory Visit | Attending: Family Medicine | Admitting: Family Medicine

## 2016-09-12 DIAGNOSIS — E041 Nontoxic single thyroid nodule: Secondary | ICD-10-CM | POA: Diagnosis not present

## 2016-09-21 ENCOUNTER — Encounter (HOSPITAL_COMMUNITY): Payer: Self-pay | Admitting: *Deleted

## 2016-09-21 ENCOUNTER — Emergency Department (HOSPITAL_COMMUNITY)
Admission: EM | Admit: 2016-09-21 | Discharge: 2016-09-21 | Disposition: A | Discharge status: Home / Self Care | Payer: No Typology Code available for payment source | Attending: Emergency Medicine | Admitting: Emergency Medicine

## 2016-09-21 ENCOUNTER — Emergency Department (HOSPITAL_COMMUNITY): Payer: No Typology Code available for payment source

## 2016-09-21 DIAGNOSIS — Y998 Other external cause status: Secondary | ICD-10-CM | POA: Diagnosis not present

## 2016-09-21 DIAGNOSIS — Y939 Activity, unspecified: Secondary | ICD-10-CM | POA: Diagnosis not present

## 2016-09-21 DIAGNOSIS — M25562 Pain in left knee: Secondary | ICD-10-CM | POA: Diagnosis present

## 2016-09-21 DIAGNOSIS — F17210 Nicotine dependence, cigarettes, uncomplicated: Secondary | ICD-10-CM | POA: Diagnosis not present

## 2016-09-21 DIAGNOSIS — Y92410 Unspecified street and highway as the place of occurrence of the external cause: Secondary | ICD-10-CM | POA: Insufficient documentation

## 2016-09-21 DIAGNOSIS — S8992XA Unspecified injury of left lower leg, initial encounter: Secondary | ICD-10-CM | POA: Diagnosis not present

## 2016-09-21 MED ORDER — NAPROXEN 250 MG PO TABS
500.0000 mg | ORAL_TABLET | Freq: Once | ORAL | Status: AC
  Administered 2016-09-21: 500 mg via ORAL
  Filled 2016-09-21: qty 2

## 2016-09-21 MED ORDER — NAPROXEN 500 MG PO TABS: 500.0000 mg | ORAL_TABLET | Freq: Two times a day (BID) | ORAL | 0 refills | Status: AC
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
## 2016-09-21 NOTE — Discharge Instructions (Signed)
Take naproxen twice a day with meals. Do not take other anti-inflammatories at the same time (Advil, ibuprofen, Aleve, Motrin). You may supplement with Tylenol as needed for further pain control. Use ice multiple times throughout the day. Use this for 20 minutes at a time. Follow-up with your primary care doctor in 1 week if your pain is not improving. Return to the emergency room if you develop numbness of the foot, color change of your foot, worsening pain, and loss of bowel or bladder control, neck or back pain, or any new or worsening symptoms.
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
## 2016-10-03 ENCOUNTER — Ambulatory Visit: Payer: Medicare HMO | Admitting: Neurology

## 2017-09-15 ENCOUNTER — Other Ambulatory Visit: Payer: Self-pay | Admitting: Family Medicine

## 2017-09-15 DIAGNOSIS — E041 Nontoxic single thyroid nodule: Secondary | ICD-10-CM
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
## 2018-11-06 IMAGING — US US THYROID
1 series · 13 of 25 positions shown · non-contrast
Comparison: None.

CLINICAL DATA: Palpable abnormality. Thyroid pain. Left thyroid
nodule.

EXAM:
THYROID ULTRASOUND
TECHNIQUE: Ultrasound examination of the thyroid gland and adjacent soft
tissues was performed.

[Series 1: us thyroid · 0.04mm/px · 13 of 53 slices shown]
[im 1/53]
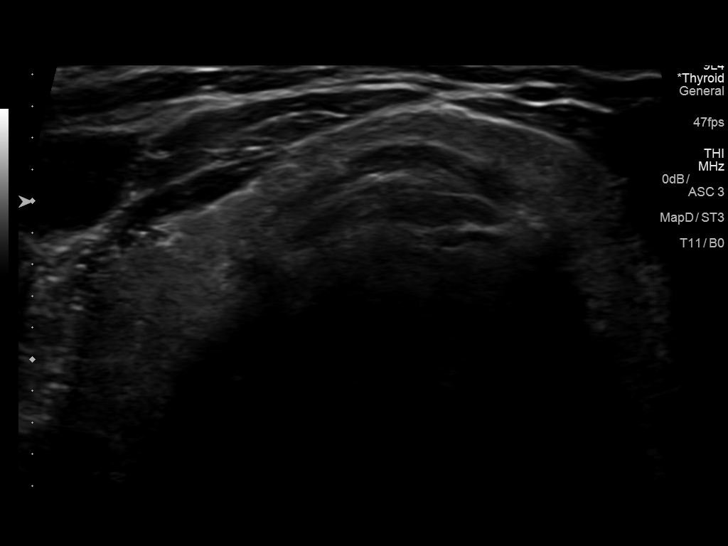
[im 5/53]
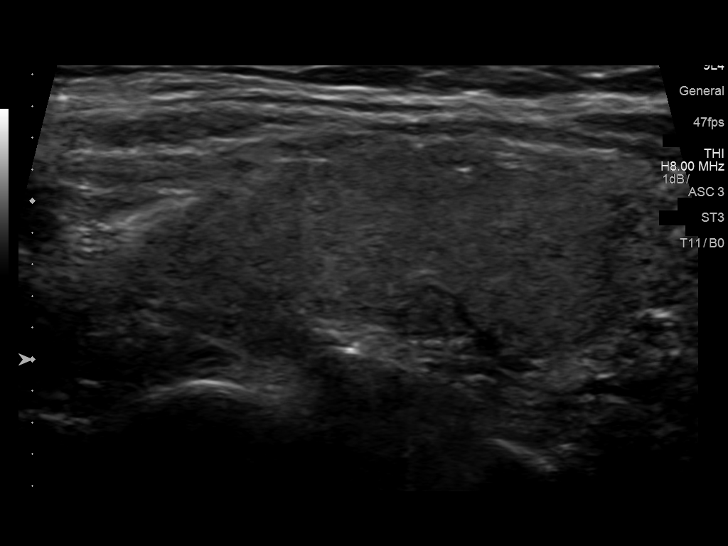
[im 9/53]
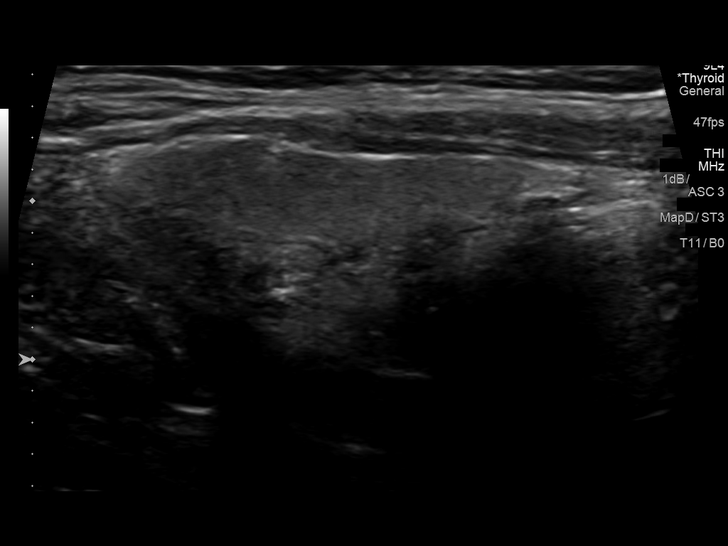
[im 14/53]
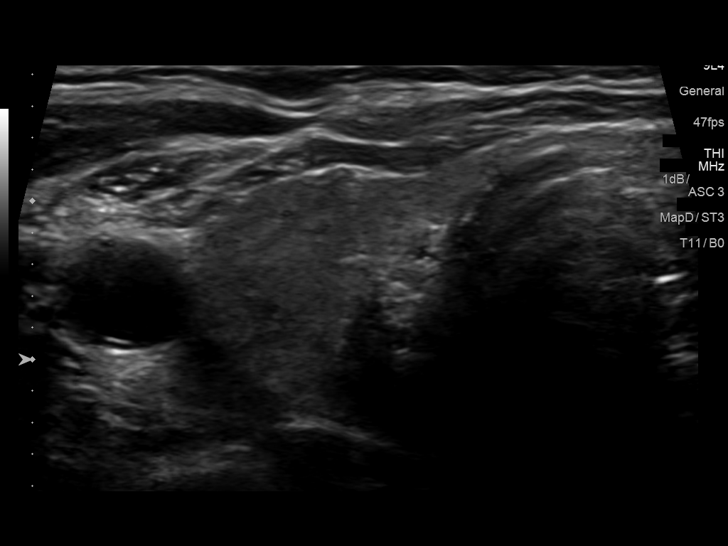
[im 18/53]
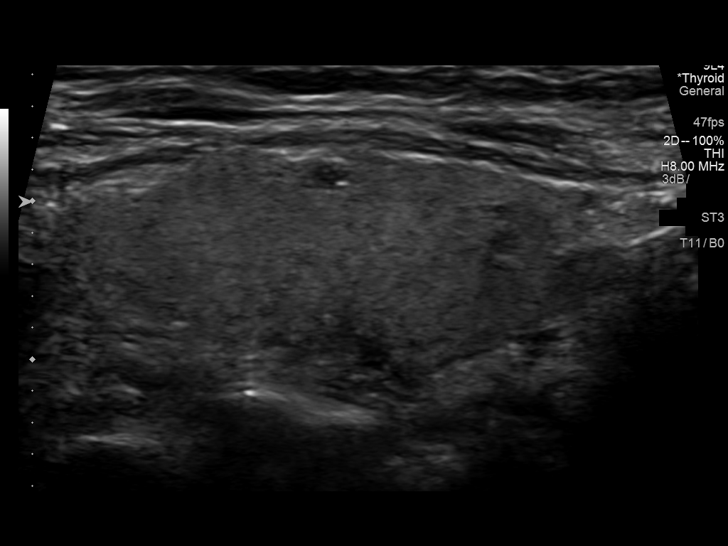
[im 22/53]
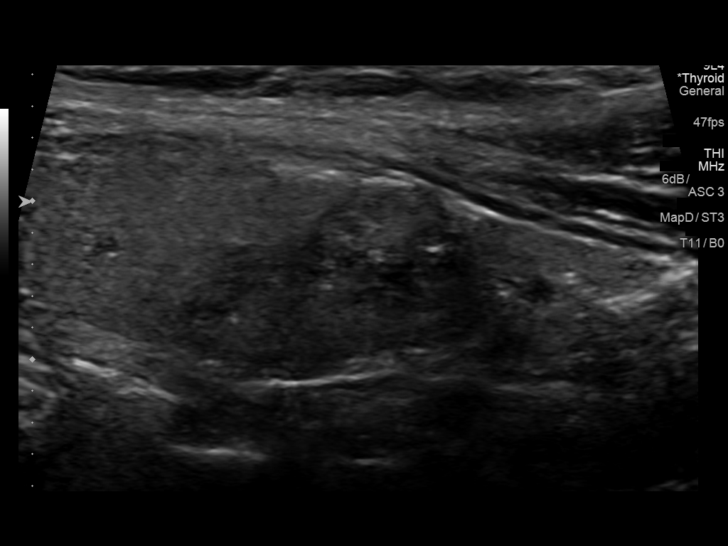
[im 27/53]
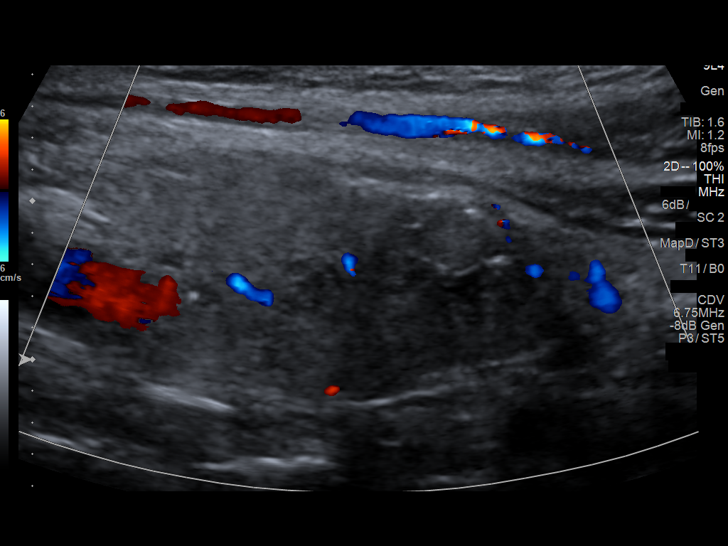
[im 31/53]
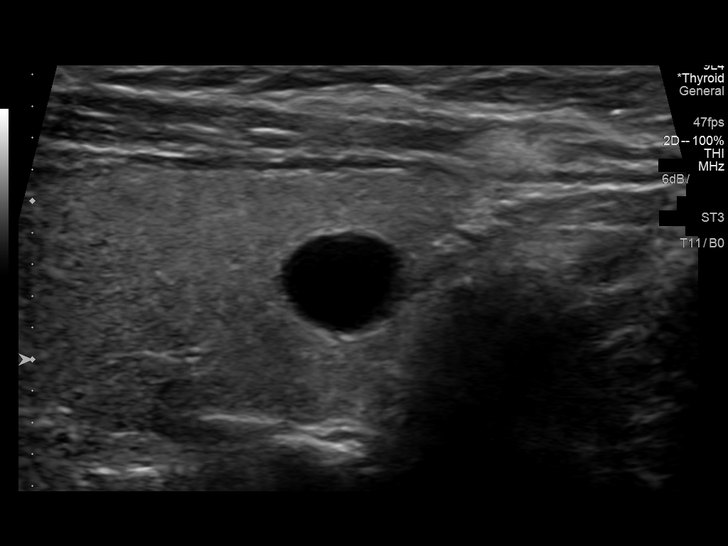
[im 35/53]
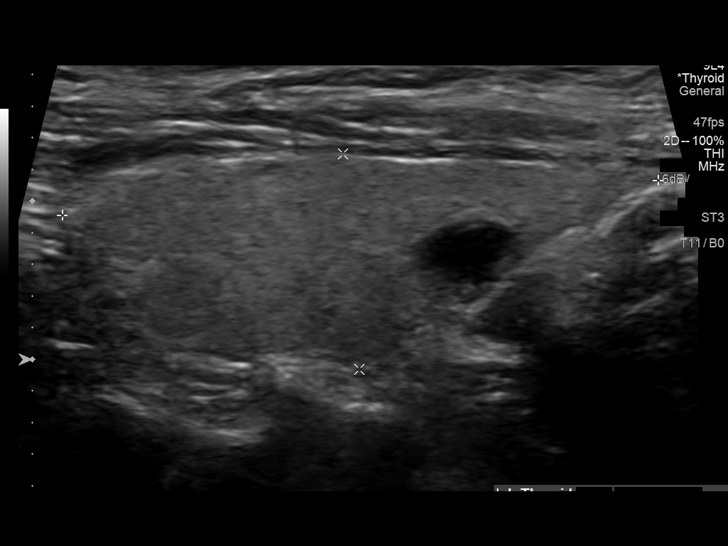
[im 40/53]
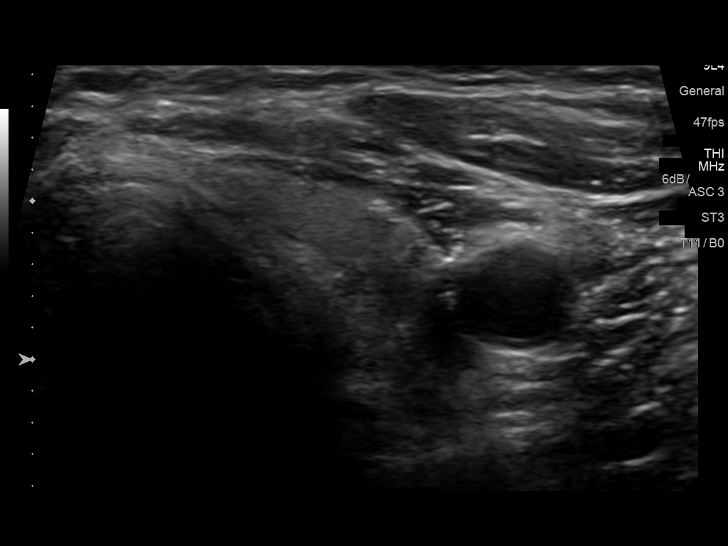
[im 44/53]
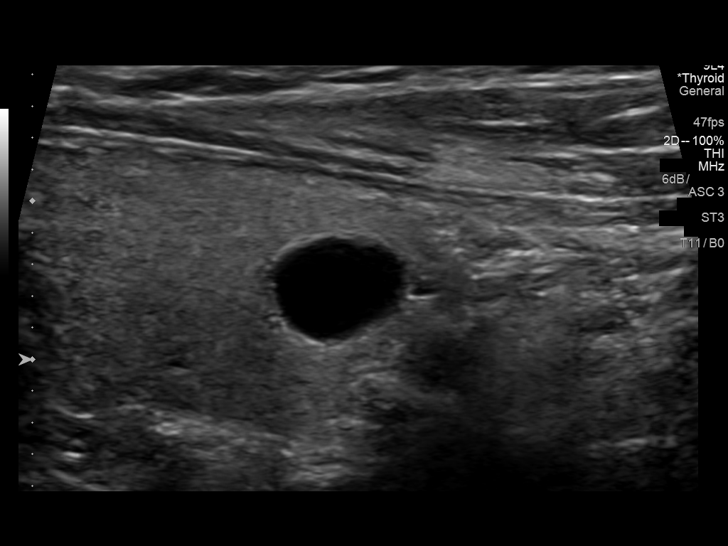
[im 48/53]
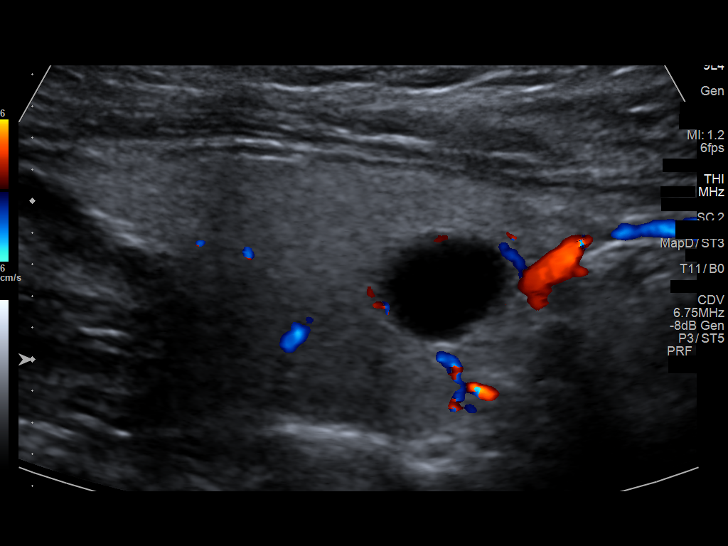
[im 53/53]
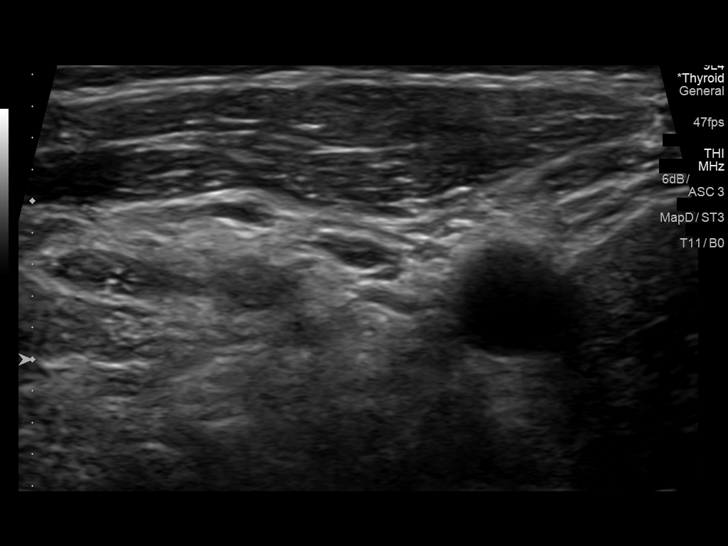

[13 of 25 positions shown; findings below may reference images not displayed]

FINDINGS: Parenchymal Echotexture: Mildly heterogenous

Isthmus: 0.3 cm

Right lobe: 3.8 x 1.6 x 1.8 cm

Left lobe: 3.8 x 1.4 x 1.3 cm

_________________________________________________________

Estimated total number of nodules >/= 1 cm: 1

Number of spongiform nodules >/=  2 cm not described below (TR1): 0

Number of mixed cystic and solid nodules >/= 1.5 cm not described
below (TR2): 0

_________________________________________________________

Nodule # 1:

Location: Right; Inferior

Maximum size: 1.2 cm; Other 2 dimensions: 0.9 x 0.8 cm

Composition: solid/almost completely solid (2)

Echogenicity: hypoechoic (2)

Shape: not taller-than-wide (0)

Margins: smooth (0)

Echogenic foci: punctate echogenic foci (3)

ACR TI-RADS total points: 7.

ACR TI-RADS risk category: TR5 (>/= 7 points).

ACR TI-RADS recommendations:

**Given size (>/= 1.0 cm) and appearance, fine needle aspiration of
this highly suspicious nodule should be considered based on TI-RADS
criteria.

_________________________________________________________

Simple cyst in the left lobe measuring 0.9 cm has a benign
appearance. Other nodules measuring less than 0.5 cm to not meet
criteria for biopsy or follow-up.
IMPRESSION: Right lobe nodule 1 meets criteria for fine needle aspiration
biopsy.

The above is in keeping with the ACR TI-RADS recommendations - [HOSPITAL] 0432;[DATE].
